# Patient Record
Sex: Male | Born: 1965 | Race: White | Hispanic: No | Marital: Married
Health system: Southern US, Community
[De-identification: ages and names within clinical notes are randomized; demographics above are authoritative.]

---

## 1999-09-01 ENCOUNTER — Encounter: Payer: Self-pay | Admitting: Orthopaedic Surgery

## 1999-09-01 ENCOUNTER — Ambulatory Visit (HOSPITAL_COMMUNITY): Admission: RE | Admit: 1999-09-01 | Discharge: 1999-09-01 | Payer: Self-pay | Admitting: Orthopaedic Surgery

## 2000-02-23 ENCOUNTER — Encounter (INDEPENDENT_AMBULATORY_CARE_PROVIDER_SITE_OTHER): Payer: Self-pay | Admitting: Specialist

## 2000-02-23 ENCOUNTER — Ambulatory Visit (HOSPITAL_COMMUNITY): Admission: RE | Admit: 2000-02-23 | Discharge: 2000-02-23 | Payer: Self-pay | Admitting: Gastroenterology

## 2007-09-20 ENCOUNTER — Encounter: Admission: RE | Admit: 2007-09-20 | Discharge: 2007-09-20 | Payer: Self-pay | Admitting: Allergy and Immunology

## 2009-01-03 IMAGING — CT CT PARANASAL SINUSES LIMITED
1 series · 16 of 28 positions shown, 20 images · IV contrast (agent unspecified)
Comparison: none

CLINICAL DATA: Sinusitis.
 LIMITED CT OF THE PARANASAL SINUSES WITHOUT CONTRAST:
TECHNIQUE: Limited coronal CT images were obtained through the paranasal sinuses without intravenous contrast.

[Series 2: limited sinus prone · axial · 0.33mm/px · z∈[+45,+145]mm · 16 of 28 slices shown, 20 images]
[im 2/28  brain]
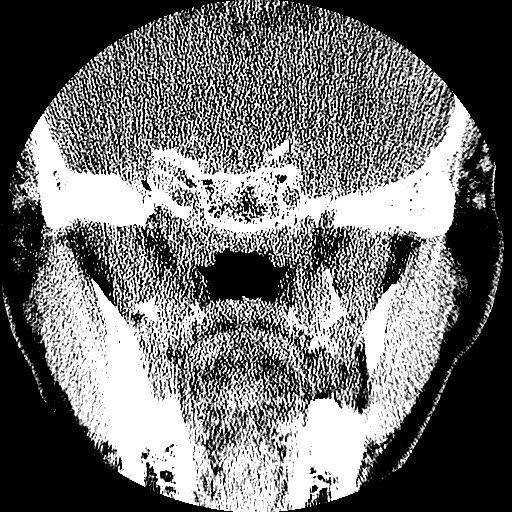
[im 2/28  bone]
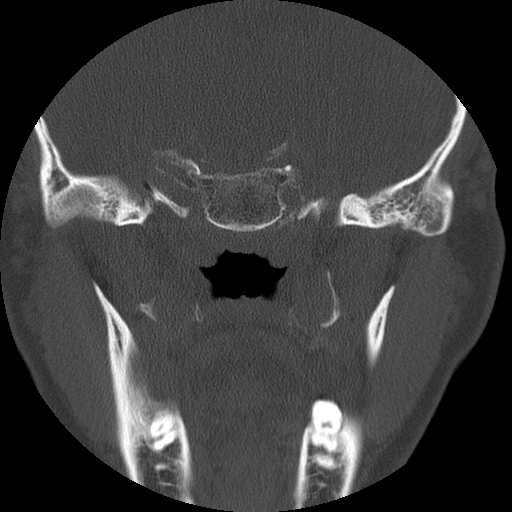
[im 4/28  bone]
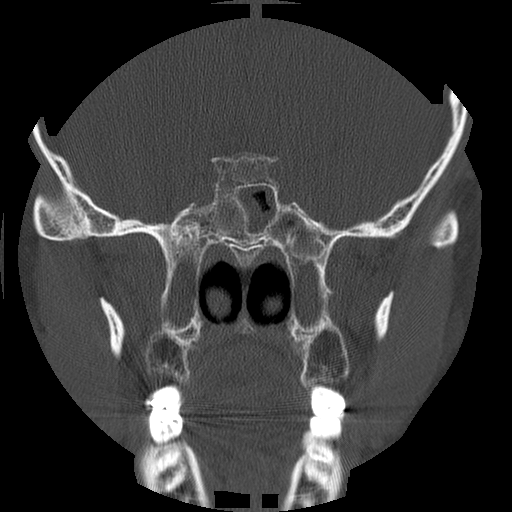
[im 6/28  bone]
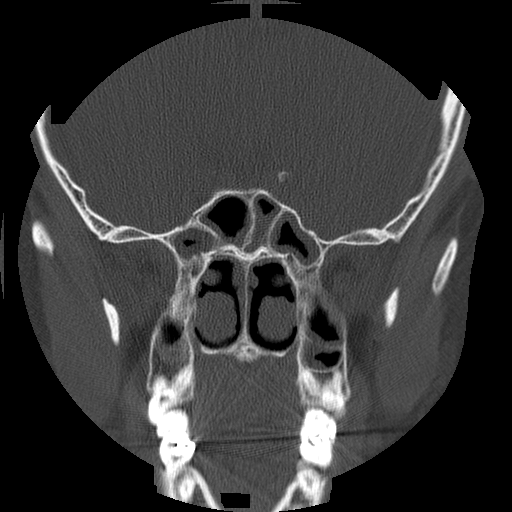
[im 7/28  bone]
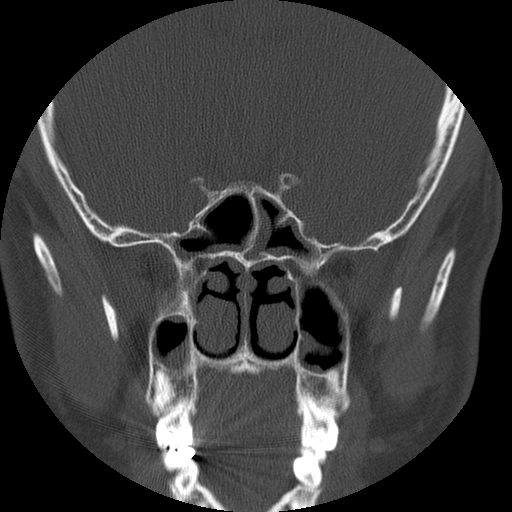
[im 9/28  brain]
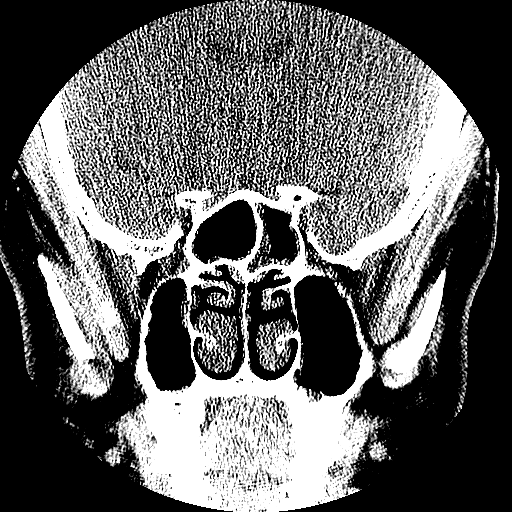
[im 9/28  bone]
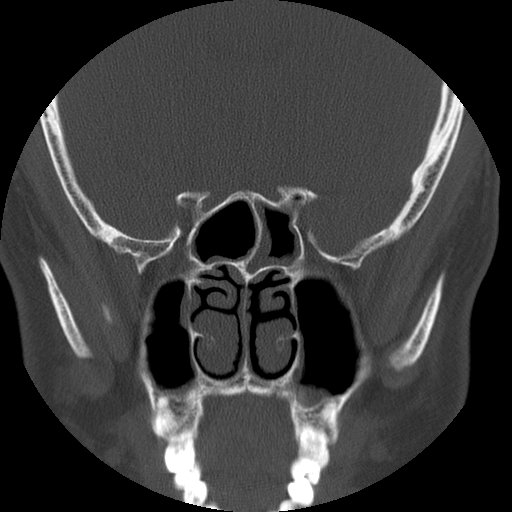
[im 10/28  bone]
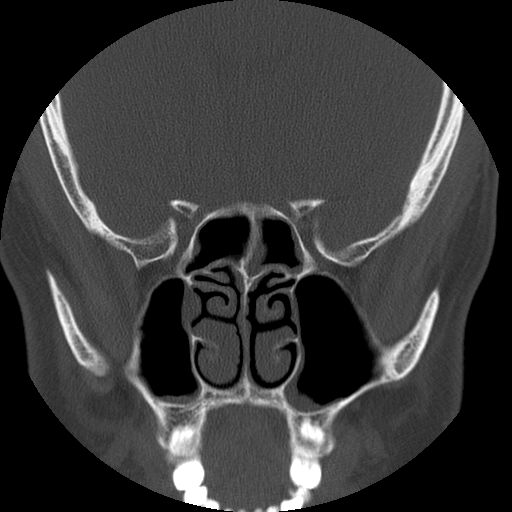
[im 12/28  bone]
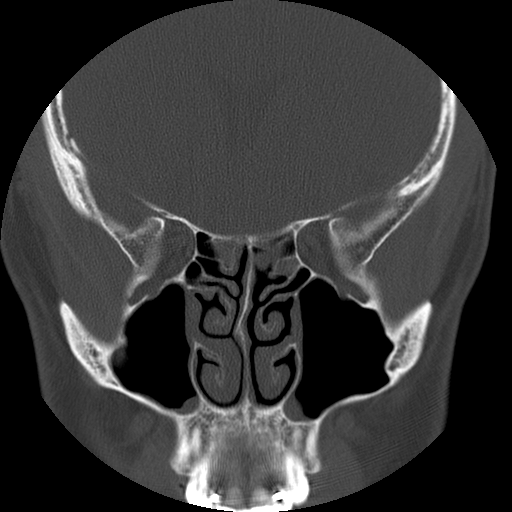
[im 14/28  bone]
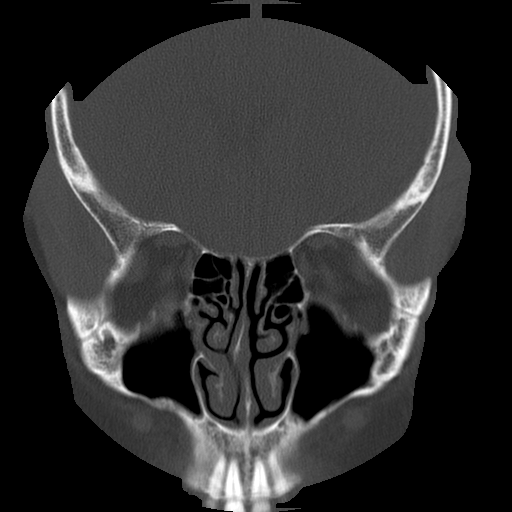
[im 15/28  brain]
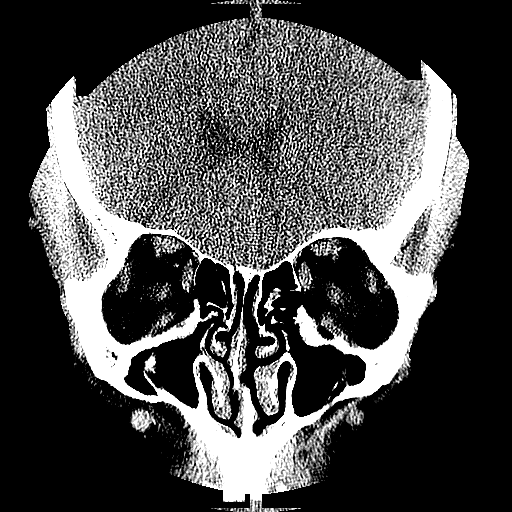
[im 15/28  bone]
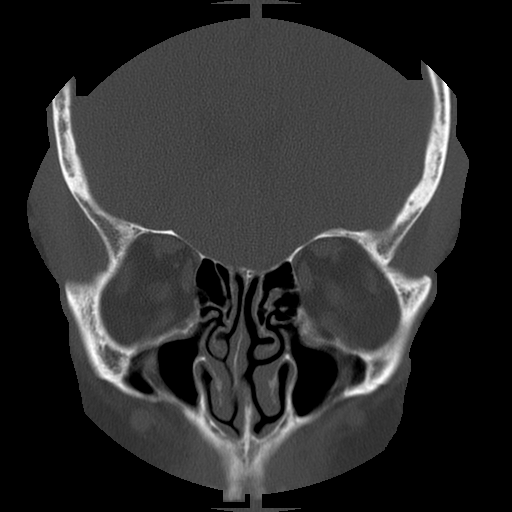
[im 17/28  bone]
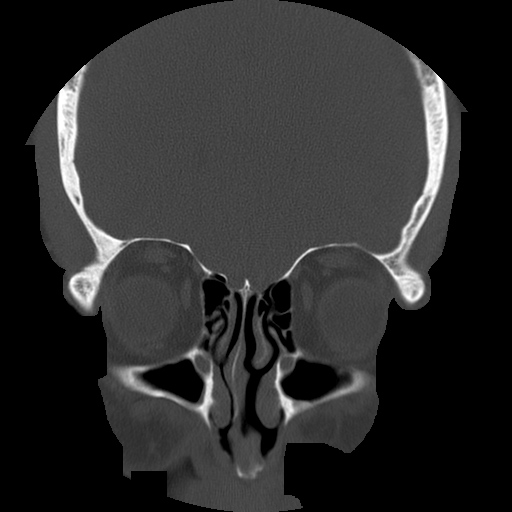
[im 19/28  bone]
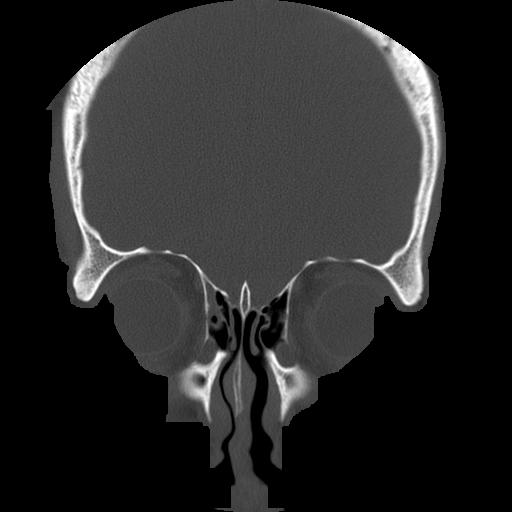
[im 20/28  bone]
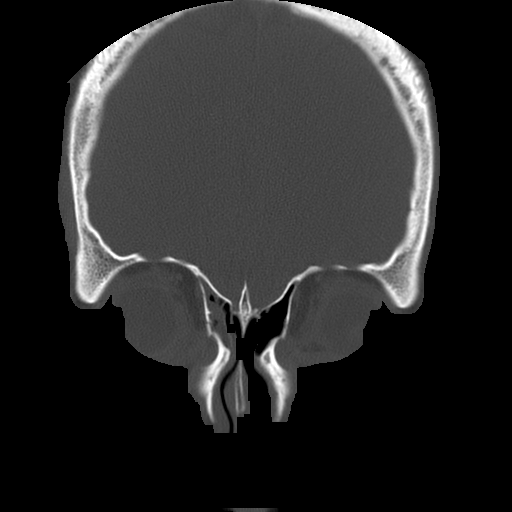
[im 22/28  brain]
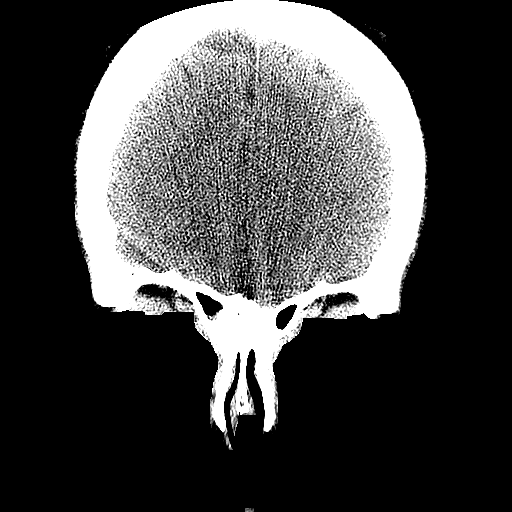
[im 22/28  bone]
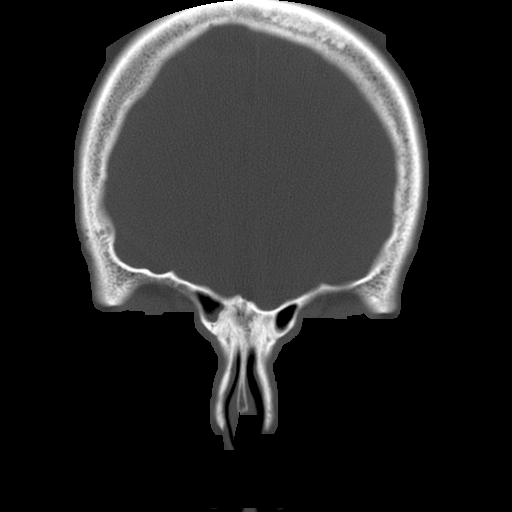
[im 23/28  bone]
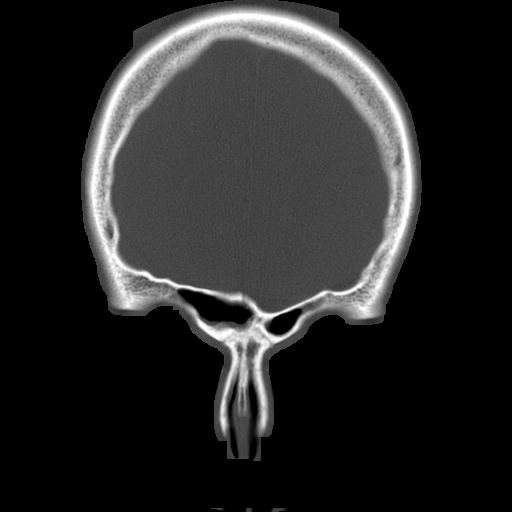
[im 25/28  bone]
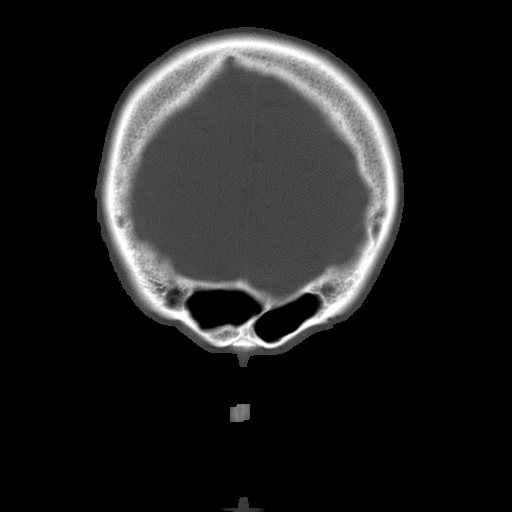
[im 27/28  bone]
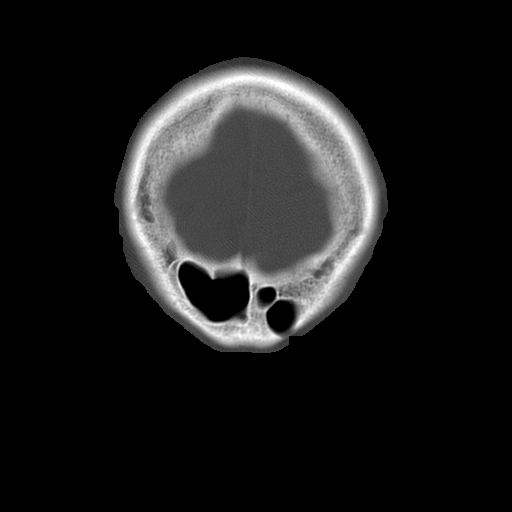

[16 of 28 positions shown; findings below may reference images not displayed]

FINDINGS: There is considerable mucosal thickening within the sphenoid sinus.  Less submucosal thickening is noted in both maxillary sinuses and within the left frontal sinus.  No air fluid level is seen.  The inferior turbinates are noted to be prominent with some compromise of the left nasal airway as a result.  Slight nasal septal deviation to the left of midline is noted.
IMPRESSION: Paranasal sinus disease, primarily involving the sphenoid sinus where significant mucosal thickening is present.  No air fluid level is seen.

## 2011-01-28 NOTE — Procedures (Signed)
[place]  Patient:    Louis Weeks, Louis Weeks                     MRN: [id number] Proc. Date: 02/23/00 Adm. Date:  [id number] Disc. Date: [id number] Attending:  Orland Mustard CC:         Thora Lance, M.D.                           Procedure Report  PROCEDURE:  Colonoscopy with biopsy.  PREMEDICATION:  Fentanyl 125 mcg and Versed 10 mg IV.  INSTRUMENTS:  Adult Olympus video colonoscope.  INDICATION:  Rectal bleeding in a 45 year old gentleman of new onset with a strong history of in a brother and aunts with ulcerative colitis.  INFORMED CONSENT:  The procedure had been explained to the patient and consent obtained.  DESCRIPTION OF PROCEDURE:  The patient in the left lateral decubitus position. The Olympus adult video colonoscope was inserted and advanced under direct visualization.  We were able to advance to the cecum without difficulty.  The ileocecal valve was seen as was the appendiceal orifice.  The scope withdrawn.  The cecum, ascending colon, hepatic flexure, transverse colon, splenic flexure, descending, and sigmoid colon were seen well upon removal.  The scope was withdrawn back into the rectum.  There was what appeared to be a very mild proctitis with several small ulcerations and loss of vascular pattern, increased granularity, and friability.  Several biopsies were taken.  The scope was withdrawn.  The patient tolerated the procedure well.  ASSESSMENT:  Probable mild proctitis, only very small if any internal hemorrhoids.  PLAN:  We will treat with Canasa 500 mg suppositories q.h.s. and see back in the office in two months. DD:  02/23/00 TD:  02/26/00 Job: [id number]

## 2020-01-04 ENCOUNTER — Ambulatory Visit: Payer: Self-pay

## 2021-01-17 ENCOUNTER — Encounter (HOSPITAL_COMMUNITY): Payer: Self-pay

## 2021-01-17 ENCOUNTER — Emergency Department (HOSPITAL_COMMUNITY): Payer: Self-pay

## 2021-01-17 ENCOUNTER — Emergency Department (HOSPITAL_COMMUNITY)
Admission: EM | Admit: 2021-01-17 | Discharge: 2021-01-17 | Disposition: A | Discharge status: Home / Self Care | Payer: Self-pay | Attending: Emergency Medicine | Admitting: Emergency Medicine

## 2021-01-17 DIAGNOSIS — Z87891 Personal history of nicotine dependence: Secondary | ICD-10-CM | POA: Insufficient documentation

## 2021-01-17 DIAGNOSIS — R0789 Other chest pain: Secondary | ICD-10-CM | POA: Insufficient documentation

## 2021-01-17 LAB — CBC WITH DIFFERENTIAL/PLATELET
Abs Immature Granulocytes: 0.02 10*3/uL (ref 0.00–0.07)
Basophils Absolute: 0.1 10*3/uL (ref 0.0–0.1)
Basophils Relative: 1 %
Eosinophils Absolute: 0.2 10*3/uL (ref 0.0–0.5)
Eosinophils Relative: 3 %
HCT: 40.6 % (ref 39.0–52.0)
Hemoglobin: 13.7 g/dL (ref 13.0–17.0)
Immature Granulocytes: 0 %
Lymphocytes Relative: 29 %
Lymphs Abs: 2.1 10*3/uL (ref 0.7–4.0)
MCH: 31.4 pg (ref 26.0–34.0)
MCHC: 33.7 g/dL (ref 30.0–36.0)
MCV: 93.1 fL (ref 80.0–100.0)
Monocytes Absolute: 0.8 10*3/uL (ref 0.1–1.0)
Monocytes Relative: 11 %
Neutro Abs: 4 10*3/uL (ref 1.7–7.7)
Neutrophils Relative %: 56 %
Platelets: 194 10*3/uL (ref 150–400)
RBC: 4.36 MIL/uL (ref 4.22–5.81)
RDW: 12.7 % (ref 11.5–15.5)
WBC: 7.1 10*3/uL (ref 4.0–10.5)
nRBC: 0 % (ref 0.0–0.2)

## 2021-01-17 LAB — COMPREHENSIVE METABOLIC PANEL
ALT: 16 U/L (ref 0–44)
AST: 19 U/L (ref 15–41)
Albumin: 4.2 g/dL (ref 3.5–5.0)
Alkaline Phosphatase: 48 U/L (ref 38–126)
Anion gap: 6 (ref 5–15)
BUN: 13 mg/dL (ref 6–20)
CO2: 29 mmol/L (ref 22–32)
Calcium: 9.2 mg/dL (ref 8.9–10.3)
Chloride: 104 mmol/L (ref 98–111)
Creatinine, Ser: 1.04 mg/dL (ref 0.61–1.24)
GFR, Estimated: >60 mL/min (ref >60)
Glucose, Bld: 111 mg/dL — ABNORMAL HIGH (ref 70–99)
Potassium: 4 mmol/L (ref 3.5–5.1)
Sodium: 139 mmol/L (ref 135–145)
Total Bilirubin: 1.1 mg/dL (ref 0.3–1.2)
Total Protein: 6.6 g/dL (ref 6.5–8.1)

## 2021-01-17 LAB — TROPONIN I (HIGH SENSITIVITY)
Troponin I (High Sensitivity): <2 ng/L (ref <18)
Troponin I (High Sensitivity): 3 ng/L (ref <18)

## 2021-01-17 LAB — D-DIMER, QUANTITATIVE: D-Dimer, Quant: 0.32 ug/mL-FEU (ref 0.00–0.50)

## 2021-01-17 LAB — PROTIME-INR
INR: 1 (ref 0.8–1.2)
Prothrombin Time: 13.5 seconds (ref 11.4–15.2)

## 2021-01-17 LAB — BRAIN NATRIURETIC PEPTIDE: B Natriuretic Peptide: 31.7 pg/mL (ref 0.0–100.0)

## 2021-01-17 LAB — LIPASE, BLOOD: Lipase: 28 U/L (ref 11–51)

## 2021-01-17 LAB — MAGNESIUM: Magnesium: 2.1 mg/dL (ref 1.7–2.4)

## 2021-01-17 MED ORDER — ASPIRIN 81 MG PO CHEW: 324.0000 mg | CHEWABLE_TABLET | Freq: Once | ORAL | Status: DC

## 2021-01-17 MED ORDER — OMEPRAZOLE 20 MG PO CPDR: 20.0000 mg | DELAYED_RELEASE_CAPSULE | Freq: Every day | ORAL | 0 refills | Status: AC

## 2021-01-17 NOTE — ED Triage Notes (Signed)
Pt arrived to ED via EMS from home. Pt with intermitting chest pain for 10 days. Pain is mid sternal and radiates right arm. Pt states its more of discomfort then pain and states he just does not feel right. Denies sob, also began with light headedness and dizzy today. Pt is alert and oriented x4  No acute distress noted at this time

## 2021-01-17 NOTE — ED Provider Notes (Signed)
[facility] EMERGENCY DEPARTMENT Provider Note   CSN: [id number] Arrival date & time: 01/17/21  1607     History Chief Complaint  Patient presents with  . Chest Pain    Louis Weeks is a 55 y.o. male.  HPI Patient arrives via EMS due to concern for new chest pain, unsettled sensation. History is obtained by the patient and EMS providers.  He notes that he is generally well.  He does drink in the evenings, is a former smoker, but is active.  Without clear precipitant about 10 days ago developed chest pressure, sternal, persistent, with occasional radiation of the right arm.  There is no dyspnea with exertion, including today while playing tennis.  No clear worsening of his chest pain with exertion, however. No fever, no nausea, vomiting, anorexia diarrhea. Patient received nitroglycerin and aspirin in route, was remarkably mildly hypertensive, with a systolic around 150, but not tachycardic, nor febrile.  Additional HPI details as below via the heart pathway score assessment tool  HPI: A 55 year old patient presents for evaluation of chest pain. Initial onset of pain was more than 6 hours ago. The patient's chest pain is described as heaviness/pressure/tightness and is not worse with exertion. The patient's chest pain is not middle- or left-sided, is not well-localized, is not sharp and does not radiate to the arms/jaw/neck. The patient does not complain of nausea and denies diaphoresis. The patient has no history of stroke, has no history of peripheral artery disease, has not smoked in the past 90 days, denies any history of treated diabetes, has no relevant family history of coronary artery disease (first degree relative at less than age 27), is not hypertensive, has no history of hypercholesterolemia and does not have an elevated BMI (>=30).   History reviewed. No pertinent past medical history.  There are no problems to display for this patient.   History  reviewed. No pertinent surgical history.     History reviewed. No pertinent family history.     Home Medications Prior to Admission medications   Not on File    Allergies    Patient has no allergy information on record.  Review of Systems   Review of Systems  Physical Exam Updated Vital Signs BP 114/80   Pulse (!) 53   Temp 98 F (36.7 C) (Oral)   Resp 14   Ht 5\' 10"  (1.778 m)   Wt 88.5 kg   SpO2 99%   BMI 27.98 kg/m   Physical Exam Vitals and nursing note reviewed.  Constitutional:      General: He is not in acute distress.    Appearance: He is well-developed.  HENT:     Head: Normocephalic and atraumatic.  Eyes:     Conjunctiva/sclera: Conjunctivae normal.  Cardiovascular:     Rate and Rhythm: Normal rate and regular rhythm.  Pulmonary:     Effort: Pulmonary effort is normal. No respiratory distress.     Breath sounds: No stridor.  Abdominal:     General: There is no distension.  Skin:    General: Skin is warm and dry.  Neurological:     Mental Status: He is alert and oriented to person, place, and time.     ED Results / Procedures / Treatments   Labs (all labs ordered are listed, but only abnormal results are displayed) Labs Reviewed  COMPREHENSIVE METABOLIC PANEL - Abnormal; Notable for the following components:      Result Value   Glucose, Bld 111 (*)  All other components within normal limits  MAGNESIUM  BRAIN NATRIURETIC PEPTIDE  CBC WITH DIFFERENTIAL/PLATELET  PROTIME-INR  LIPASE, BLOOD  D-DIMER, QUANTITATIVE  CBG MONITORING, ED  TROPONIN I (HIGH SENSITIVITY)  TROPONIN I (HIGH SENSITIVITY)    EKG EKG Interpretation  Date/Time:  Sunday Jan 17 2021 16:10:23 EDT Ventricular Rate:  72 PR Interval:  161 QRS Duration: 106 QT Interval:  385 QTC Calculation: 422 R Axis:   42 Text Interpretation: Sinus rhythm Confirmed by Gerhard Munch [phone]) on 01/17/2021 4:21:11 PM   Radiology DG Chest Portable 1 View  Result Date:  01/17/2021 CLINICAL DATA:  Chest pain. EXAM: PORTABLE CHEST 1 VIEW COMPARISON:  None. FINDINGS: The heart size and mediastinal contours are within normal limits. Both lungs are clear. The visualized skeletal structures are unremarkable. IMPRESSION: No active disease. Electronically Signed   By: Beckie Salts M.D.   On: 01/17/2021 17:05    Procedures Procedures   Medications Ordered in ED Medications  aspirin chewable tablet 324 mg (324 mg Oral Not Given 01/17/21 1618)    ED Course  I have reviewed the triage vital signs and the nursing notes.  Pertinent labs & imaging results that were available during my care of the patient were reviewed by me and considered in my medical decision making (see chart for details).  On repeat exam patient is accompanied by his wife.  We discussed initial findings, reassuring. He is in no distress.  I reviewed the patient's chart, notable for colonoscopy performed more than 1 decade ago.  Patient had failed them reportedly with ulcerative colitis.  On repeat exam he denies history of recent GI issues, states that he was previously diagnosed this, but has not taken medication for it and states that he believes this is another diagnosis for him.  7:59 PM Patient in no distress, vital signs notable for normotension, no tachycardia, no distress, he states he feels okay.  He, his wife and I had a lengthy conversation about today's presentation for pressure which has been going on for several days.  He has low risk profile, 2 troponins are normal, EKG is nonischemic, D-dimer which is negative, both reassuring for low suspicion of PE, and with commendation of aortic dissection risk score low suspicion for dissection, substantiated with the patient's absence of neurologic complaints, and hypertension.  We discussed the importance of following up with our cardiology clinic given his chest pain, age, for appropriate ongoing additional studies.  Patient discharged in stable  condition.   MDM Rules/Calculators/A&P HEAR Score: 1                        Final Clinical Impression(s) / ED Diagnoses Final diagnoses:  Atypical chest pain     Gerhard Munch, MD 01/17/21 2003

## 2021-01-17 NOTE — Discharge Instructions (Signed)
As discussed, today's evaluation has been reassuring.  However, although the most dangerous causes for chest discomfort have been evaluated, and are not currently present, additional studies may be required after you have an evaluation with our cardiology colleagues.  Please call tomorrow for next available appointment, inform them that you are seen in the emergency department, and need follow-up for chest pain.  Return here for concerning changes in your condition.  In the interim, with some consideration of other causes for pain such as gastroesophageal issues, you are starting a medication.  Please take this as directed.

## 2021-01-17 NOTE — ED Notes (Signed)
Patient verbalizes understanding of discharge instructions. Opportunity for questioning and answers were provided. Armband removed by staff, pt discharged from ED ambulatory.   

## 2022-05-03 IMAGING — DX DG CHEST 1V PORT
2 series · 2 of 2 positions shown · non-contrast
Comparison: None.

CLINICAL DATA: Chest pain.

EXAM:
PORTABLE CHEST 1 VIEW

[chest ap (1 of 2)]
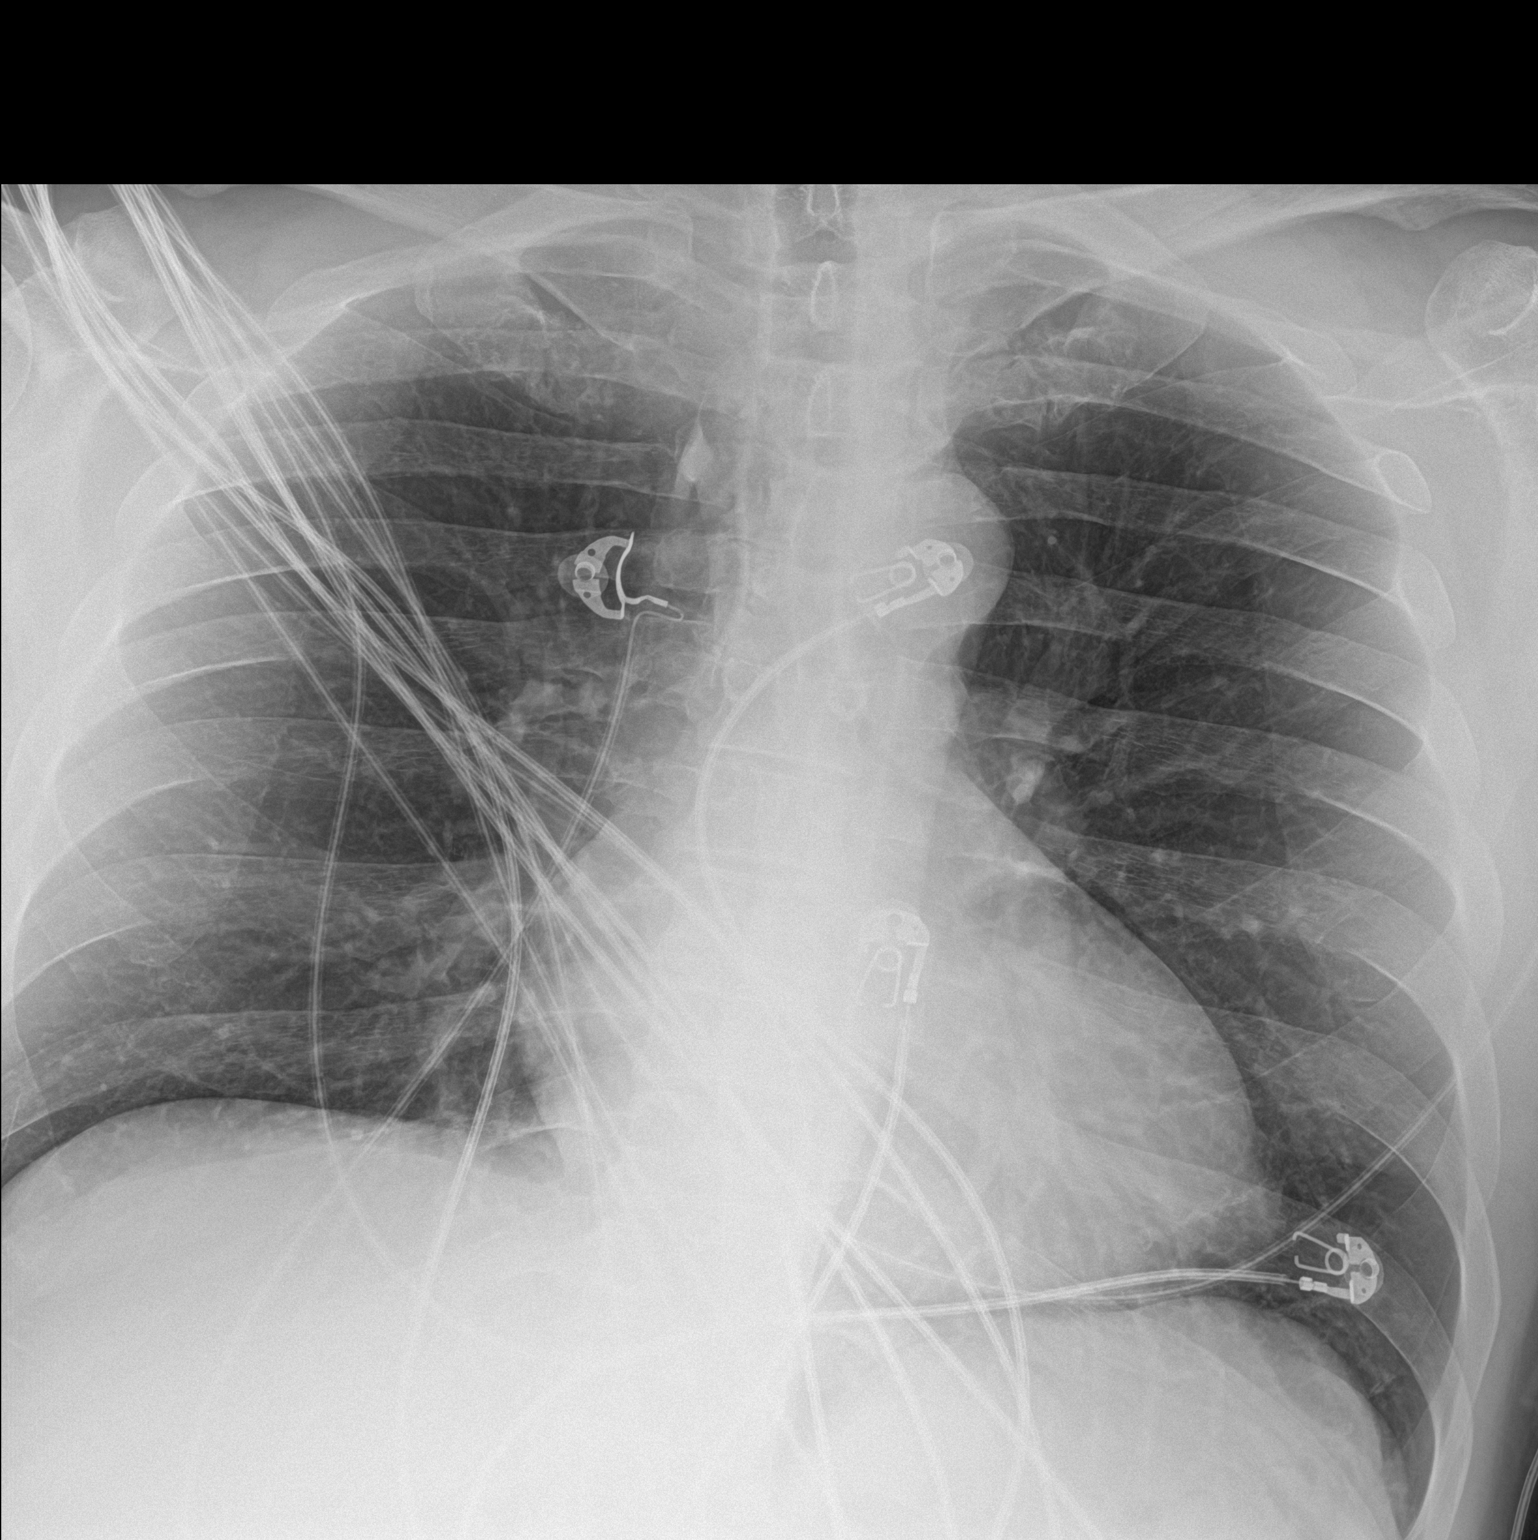

[chest ap (2 of 2)]
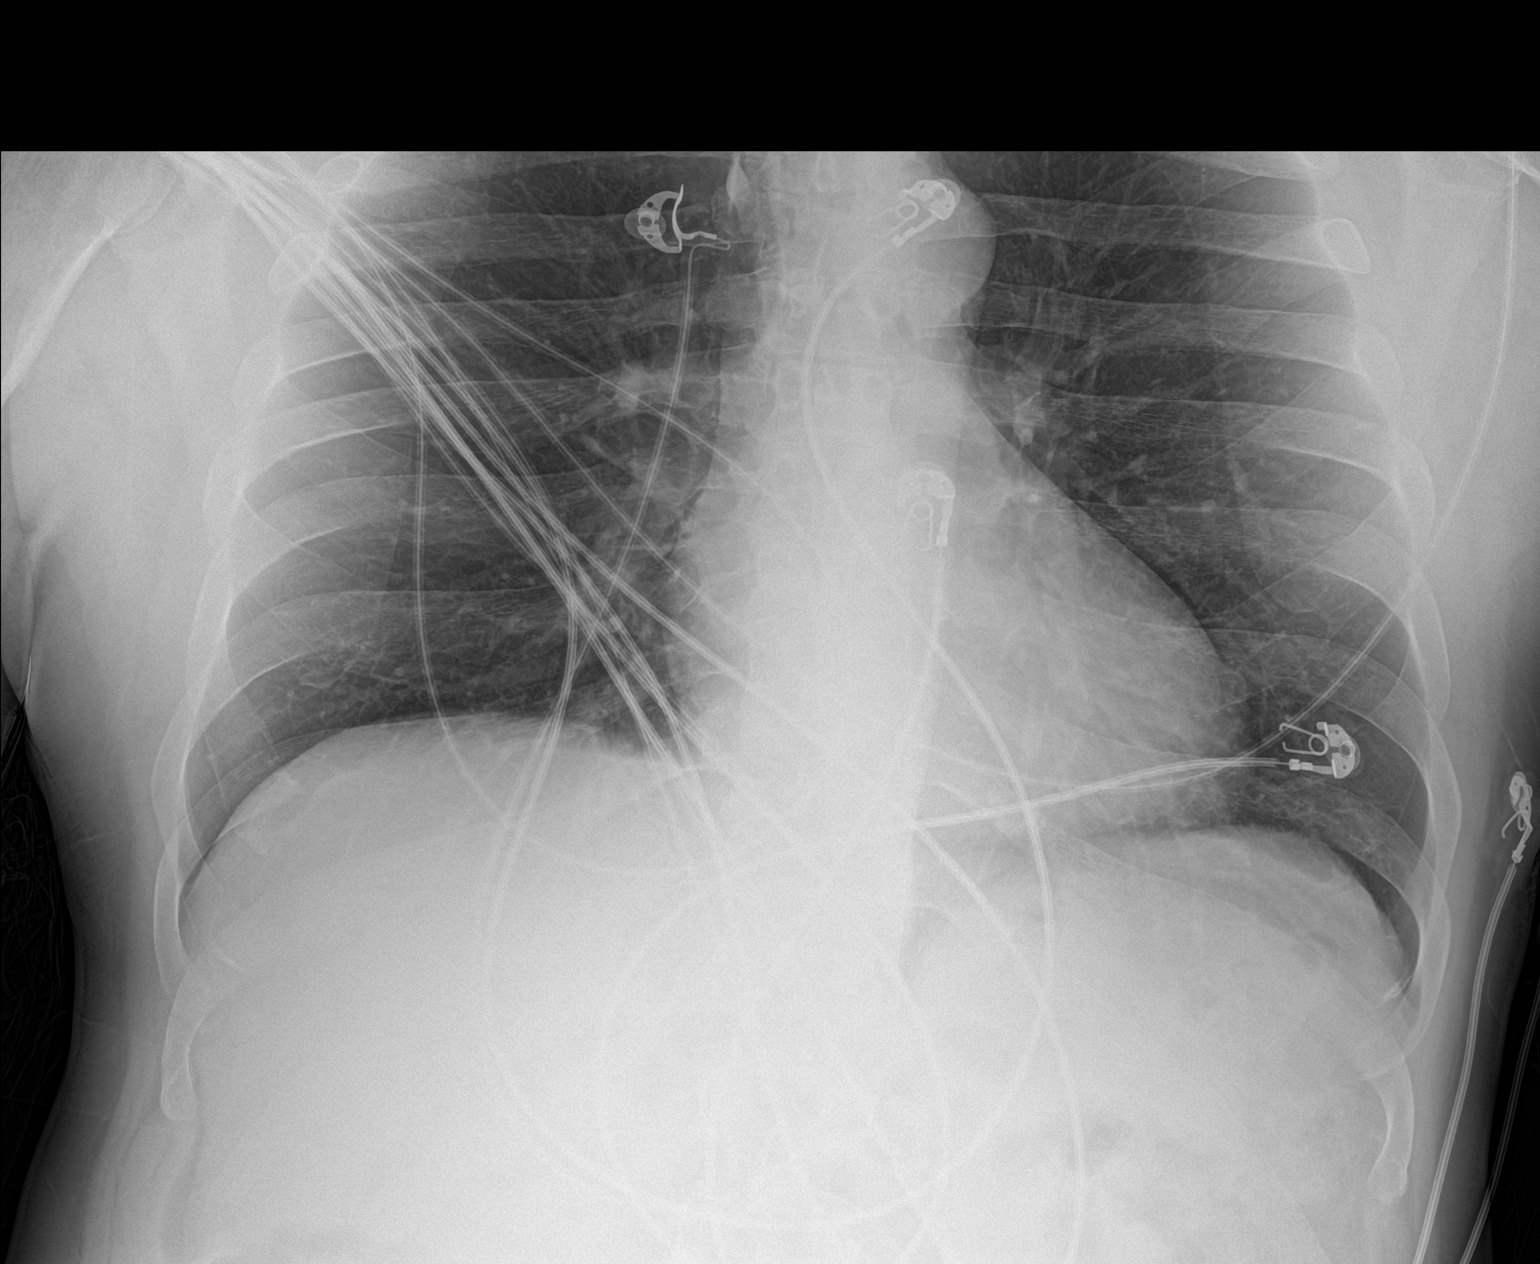

[2 of 2 positions shown; findings below may reference images not displayed]

FINDINGS: The heart size and mediastinal contours are within normal limits.
Both lungs are clear. The visualized skeletal structures are
unremarkable.
IMPRESSION: No active disease.

## 2023-03-29 ENCOUNTER — Other Ambulatory Visit (HOSPITAL_COMMUNITY): Payer: Self-pay | Admitting: Internal Medicine

## 2023-03-29 DIAGNOSIS — E7800 Pure hypercholesterolemia, unspecified: Secondary | ICD-10-CM

## 2023-05-10 ENCOUNTER — Other Ambulatory Visit (HOSPITAL_COMMUNITY): Payer: Self-pay

## 2023-06-05 ENCOUNTER — Encounter (HOSPITAL_COMMUNITY): Payer: Self-pay

## 2023-06-05 ENCOUNTER — Ambulatory Visit (HOSPITAL_COMMUNITY)
Admission: RE | Admit: 2023-06-05 | Discharge: 2023-06-05 | Disposition: A | Discharge status: Home / Self Care | Payer: Self-pay | Source: Ambulatory Visit | Attending: Internal Medicine | Admitting: Internal Medicine

## 2023-06-05 DIAGNOSIS — E7800 Pure hypercholesterolemia, unspecified: Secondary | ICD-10-CM | POA: Insufficient documentation
